# Patient Record
Sex: Female | Born: 1980 | Race: Black or African American | Hispanic: No | Marital: Married | Smoking: Never smoker
Health system: Southern US, Community
[De-identification: ages and names within clinical notes are randomized; demographics above are authoritative.]

## PROBLEM LIST (undated history)

## (undated) DIAGNOSIS — E119 Type 2 diabetes mellitus without complications: Secondary | ICD-10-CM

---

## 2017-06-17 DIAGNOSIS — E119 Type 2 diabetes mellitus without complications: Secondary | ICD-10-CM | POA: Insufficient documentation

## 2020-12-28 ENCOUNTER — Encounter (HOSPITAL_BASED_OUTPATIENT_CLINIC_OR_DEPARTMENT_OTHER): Payer: Self-pay

## 2020-12-28 ENCOUNTER — Other Ambulatory Visit: Payer: Self-pay

## 2020-12-28 ENCOUNTER — Emergency Department (HOSPITAL_BASED_OUTPATIENT_CLINIC_OR_DEPARTMENT_OTHER): Payer: BC Managed Care – PPO | Admitting: Radiology

## 2020-12-28 ENCOUNTER — Emergency Department (HOSPITAL_BASED_OUTPATIENT_CLINIC_OR_DEPARTMENT_OTHER)
Admission: EM | Admit: 2020-12-28 | Discharge: 2020-12-29 | Disposition: A | Discharge status: Home / Self Care | Payer: BC Managed Care – PPO | Attending: Emergency Medicine | Admitting: Emergency Medicine

## 2020-12-28 DIAGNOSIS — R0789 Other chest pain: Secondary | ICD-10-CM | POA: Diagnosis not present

## 2020-12-28 DIAGNOSIS — M25571 Pain in right ankle and joints of right foot: Secondary | ICD-10-CM | POA: Insufficient documentation

## 2020-12-28 DIAGNOSIS — E119 Type 2 diabetes mellitus without complications: Secondary | ICD-10-CM | POA: Diagnosis not present

## 2020-12-28 DIAGNOSIS — Y92410 Unspecified street and highway as the place of occurrence of the external cause: Secondary | ICD-10-CM | POA: Diagnosis not present

## 2020-12-28 DIAGNOSIS — S3991XA Unspecified injury of abdomen, initial encounter: Secondary | ICD-10-CM | POA: Diagnosis present

## 2020-12-28 DIAGNOSIS — M79642 Pain in left hand: Secondary | ICD-10-CM | POA: Insufficient documentation

## 2020-12-28 DIAGNOSIS — S301XXA Contusion of abdominal wall, initial encounter: Secondary | ICD-10-CM | POA: Diagnosis not present

## 2020-12-28 DIAGNOSIS — T07XXXA Unspecified multiple injuries, initial encounter: Secondary | ICD-10-CM

## 2020-12-28 DIAGNOSIS — S3981XA Other specified injuries of abdomen, initial encounter: Secondary | ICD-10-CM

## 2020-12-28 HISTORY — DX: Type 2 diabetes mellitus without complications: E11.9

## 2020-12-28 LAB — CBG MONITORING, ED: Glucose-Capillary: 110 mg/dL — ABNORMAL HIGH (ref 70–99)

## 2020-12-28 MED ORDER — HYDROCODONE-ACETAMINOPHEN 5-325 MG PO TABS
1.0000 | ORAL_TABLET | Freq: Once | ORAL | Status: AC
  Administered 2020-12-28: 1 via ORAL
  Filled 2020-12-28: qty 1
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
## 2020-12-29 MED ORDER — NAPROXEN 500 MG PO TABS: 500.0000 mg | ORAL_TABLET | Freq: Two times a day (BID) | ORAL | 0 refills | Status: DC

## 2020-12-29 MED ORDER — ACETAMINOPHEN 500 MG PO TABS: 500.0000 mg | ORAL_TABLET | Freq: Four times a day (QID) | ORAL | 0 refills | Status: DC | PRN
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
## 2020-12-30 ENCOUNTER — Ambulatory Visit (INDEPENDENT_AMBULATORY_CARE_PROVIDER_SITE_OTHER): Payer: Self-pay | Admitting: Orthopaedic Surgery

## 2020-12-30 ENCOUNTER — Other Ambulatory Visit: Payer: Self-pay

## 2020-12-30 VITALS — Ht 65.0 in | Wt 390.0 lb

## 2020-12-30 DIAGNOSIS — M25532 Pain in left wrist: Secondary | ICD-10-CM
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
## 2021-05-25 DIAGNOSIS — O99810 Abnormal glucose complicating pregnancy: Secondary | ICD-10-CM | POA: Insufficient documentation

## 2021-05-25 DIAGNOSIS — R87610 Atypical squamous cells of undetermined significance on cytologic smear of cervix (ASC-US): Secondary | ICD-10-CM | POA: Insufficient documentation

## 2021-05-25 DIAGNOSIS — I10 Essential (primary) hypertension: Secondary | ICD-10-CM | POA: Insufficient documentation

## 2021-05-25 DIAGNOSIS — IMO0002 Reserved for concepts with insufficient information to code with codable children: Secondary | ICD-10-CM | POA: Insufficient documentation

## 2021-07-20 DIAGNOSIS — Z9889 Other specified postprocedural states: Secondary | ICD-10-CM | POA: Insufficient documentation

## 2021-08-31 ENCOUNTER — Encounter: Payer: Self-pay | Admitting: Neurology

## 2021-08-31 ENCOUNTER — Ambulatory Visit: Payer: BC Managed Care – PPO | Admitting: Neurology

## 2021-08-31 ENCOUNTER — Telehealth: Payer: Self-pay | Admitting: Neurology

## 2021-08-31 VITALS — BP 145/88 | HR 70 | Ht 65.0 in | Wt 395.0 lb

## 2021-08-31 DIAGNOSIS — G43709 Chronic migraine without aura, not intractable, without status migrainosus: Secondary | ICD-10-CM

## 2021-08-31 DIAGNOSIS — G932 Benign intracranial hypertension: Secondary | ICD-10-CM | POA: Diagnosis not present

## 2021-08-31 NOTE — Progress Notes (Addendum)
Chief Complaint  Patient presents with   New Patient (Initial Visit)    Rm 15. Alone. NP/Paper/[facility] OD/psuedopapilledema of bilateral optic disc.      ASSESSMENT AND PLAN  Carolyn Cantrell is a 41 y.o. female   Pseudotumor cerebri  She does have risk factors of morbidly obese, blurry optic nerve head edge  Refer to ophthalmologist to confirm the findings, also do a formal visual field testing  MRI of the brain without contrast  Will refer for fluoroscopy guided lumbar puncture MRI of the brain is normal  Chronic migraine headaches  Aleve as needed    DIAGNOSTIC DATA (LABS, IMAGING, TESTING) - I reviewed patient records, labs, notes, testing and imaging myself where available.  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 Carrie Mew, OD for evaluation of bilateral papillary edema, her primary care is nurse practitioner Felix Pacini, initial evaluation was on August 31 2021  I reviewed and summarized the referring note. PMHX. HLD DM  She reported gradual weight gain over the past 3 years, began to have frequent headaches, usually behind her eyes, retro-orbital area, sometimes blurry vision, she thought she might need a new pair of glasses, was seen by optometrist after few years, was noted to have  possible bilateral papillary edema  She did have increased headache over the past few years, sometimes with associated light noise sensitivity, nauseous, lasting for few hours   PHYSICAL EXAM:   Vitals:   08/31/21 0824  BP: (!) 145/88  Pulse: 70  Weight: (!) 395 lb (179.2 kg)  Height: 5\' 5"  (1.651 m)   Not recorded     Body mass index is 65.73 kg/m.  PHYSICAL EXAMNIATION:  Gen: NAD, conversant, well nourised, well groomed                     Cardiovascular: Regular rate rhythm, no peripheral edema, warm, nontender. Eyes: Conjunctivae clear without exudates or hemorrhage Neck: Supple, no carotid bruits. Pulmonary: Clear to auscultation bilaterally   NEUROLOGICAL EXAM:  MENTAL STATUS: Speech/cognition: Awake, alert, oriented to history taking and casual conversation CRANIAL NERVES: CN II: Visual fields are full to confrontation. Pupils are round equal and briskly reactive to light.  Funduscopy examination showed blurry edge at bilateral optic nerve CN III, IV, VI: extraocular movement are normal. No ptosis. CN V: Facial sensation is intact to light touch CN VII: Face is symmetric with normal eye closure  CN VIII: Hearing is normal to causal conversation. CN IX, X: Phonation is normal. CN XI: Head turning and shoulder shrug are intact  MOTOR: There is no pronator drift of out-stretched arms. Muscle bulk and tone are normal. Muscle strength is normal.  REFLEXES: Reflexes are hypoactive and symmetric at the biceps, triceps, knees, and ankles. Plantar responses are flexor.  SENSORY: Intact to light touch, pinprick and vibratory sensation are intact in fingers and toes.  COORDINATION:  There is no trunk or limb dysmetria noted.  GAIT/STANCE: Need push-up to get up from seated position, limited by her big body habitus  REVIEW OF SYSTEMS:  Full 14 system review of systems performed and notable only for as above All other review of systems were  negative.   ALLERGIES: Allergies  Allergen Reactions   Cefaclor Hives    HOME MEDICATIONS: Current Outpatient Medications  Medication Sig Dispense Refill   Ascorbic Acid (VITAMIN C PO) Take by mouth.     atorvastatin (LIPITOR) 20 MG tablet Take 20 mg by mouth daily.     ELDERBERRY PO Take by mouth.     MAGNESIUM PO Take by mouth.     metFORMIN (GLUCOPHAGE) 500 MG tablet Take 500 mg by mouth 2 (two) times daily.     Multiple Vitamins-Minerals (MULTIVITAMIN ADULTS PO) Take by mouth.     Multiple Vitamins-Minerals (ZINC PO) Take by mouth.     Semaglutide (OZEMPIC, 0.25 OR 0.5 MG/DOSE, [place]) Inject 0.25 mg into the skin once a week.     VITAMIN D, CHOLECALCIFEROL, PO Take by mouth.     No current facility-administered medications for this visit.    PAST MEDICAL HISTORY: Past Medical History:  Diagnosis Date   Diabetes mellitus without complication (HCC)     PAST SURGICAL HISTORY: History reviewed. No pertinent surgical history.  FAMILY HISTORY: History reviewed. No pertinent family history.  SOCIAL HISTORY: Social History   Socioeconomic History   Marital status: Married    Spouse name: Not on file   Number of children: Not on file   Years of education: Not on file   Highest education level: Not on file  Occupational History   Not on file  Tobacco Use   Smoking status: Never   Smokeless tobacco: Never  Substance and Sexual Activity   Alcohol use: Not on file   Drug use: Not on file   Sexual activity: Not on file  Other Topics Concern   Not on file  Social History Narrative   Not on file   Social Determinants of Health   Financial Resource Strain: Not on file  Food Insecurity: Not on file  Transportation Needs: Not on file  Physical Activity: Not on file  Stress: Not on file  Social Connections: Not on file  Intimate Partner Violence: Not on file      Levert Feinstein, M.D. Ph.D.  [facility] [address], [address] [place], [place]  [address] Ph: [phone] Fax: [phone]  CC:  Carrie Mew, OD [address] [place],  [place] [address]  Felix Pacini, FNP

## 2021-08-31 NOTE — Telephone Encounter (Signed)
Referral for Ophthalmology sent to [facility] [phone].

## 2021-09-12 ENCOUNTER — Telehealth: Payer: Self-pay | Admitting: Neurology

## 2021-09-12 NOTE — Telephone Encounter (Signed)
[employer] NPR via website sent to GI 

## 2021-09-13 ENCOUNTER — Telehealth: Payer: Self-pay | Admitting: Neurology

## 2021-09-13 DIAGNOSIS — G932 Benign intracranial hypertension: Secondary | ICD-10-CM

## 2021-09-13 LAB — HM DIABETES EYE EXAM

## 2021-09-13 MED ORDER — TOPIRAMATE 100 MG PO TABS: 100.0000 mg | ORAL_TABLET | Freq: Two times a day (BID) | ORAL | 11 refills | Status: DC

## 2021-09-13 NOTE — Telephone Encounter (Signed)
Feedback from ophthalmologist Dr. Alden Hipp, patient does have grade 1-2 bilateral edema OU, close to 360 degree margin protein, no vessel obturation, definite evidence of papillary edema  She is on schedule for MRI of the brain July 5  Please move up her MRI of the brain to next 1 to 3 days  I am also ordered fluoroscopy guided lumbar puncture to be done after MRI of the brain  Also add on Topamax 100 mg twice daily for migraine prevention, decreased CSF secretion, more than 25% the patient would lose weight taking Topamax  Orders Placed This Encounter  Procedures   DG FL GUIDED LUMBAR PUNCTURE      Meds ordered this encounter  Medications   topiramate (TOPAMAX) 100 MG tablet    Sig: Take 1 tablet (100 mg total) by mouth 2 (two) times daily.    Dispense:  60 tablet    Refill:  11

## 2021-09-13 NOTE — Telephone Encounter (Signed)
I sent a message to [facility] asking them to move the MRI up

## 2021-09-14 NOTE — Telephone Encounter (Signed)
Called pt back. Advised Dr. Terrace Arabia is aware her MRI got moved to 09/16/21. She confirmed.  Advised of Dr. Catalina Gravel message: "Also add on Topamax 100 mg twice daily for migraine prevention, decreased CSF secretion, more than 25% the patient would lose weight taking Topamax" Pt confirmed she picked up this prescription yesterday. I went over directions. She will go ahead and start this.   She will also ask [facility] about scheduling LP for after MRI.

## 2021-09-16 ENCOUNTER — Ambulatory Visit
Admission: RE | Admit: 2021-09-16 | Discharge: 2021-09-16 | Disposition: A | Discharge status: Home / Self Care | Payer: BC Managed Care – PPO | Source: Ambulatory Visit | Attending: Neurology | Admitting: Neurology

## 2021-09-16 DIAGNOSIS — G932 Benign intracranial hypertension: Secondary | ICD-10-CM

## 2021-09-19 ENCOUNTER — Telehealth: Payer: Self-pay | Admitting: Neurology

## 2021-09-19 NOTE — Telephone Encounter (Signed)
Please call patient, MRI of the brain showed no significant abnormality, incidental changes of chronic paranasal sinusitis, partially empty sella with dilatation of the CSF space around retro-orbital portion of optic nerve,  Please make sure that she is on schedule for fluoroscopy guided lumbar puncture for intracranial hypertension

## 2021-09-20 NOTE — Telephone Encounter (Signed)
I spoke to the patient and provided her with the MRI results. She is scheduling her LP and will keep her pending appt on 10/10/21.  As a side note, she mentioned she was seen by her PCP today. Says she was placed on an antibiotic for a sinus infection.

## 2021-09-20 NOTE — Telephone Encounter (Signed)
Left message requesting a return call.

## 2021-09-25 ENCOUNTER — Telehealth: Payer: Self-pay | Admitting: *Deleted

## 2021-09-25 NOTE — Telephone Encounter (Signed)
I spoke with the patient and informed her of results. She stated her lumbar puncture has been scheduled for 10/04/2021. She verbalized understanding and expressed appreciation for the call.

## 2021-10-04 ENCOUNTER — Telehealth: Payer: Self-pay | Admitting: *Deleted

## 2021-10-04 ENCOUNTER — Ambulatory Visit
Admission: RE | Admit: 2021-10-04 | Discharge: 2021-10-04 | Disposition: A | Discharge status: Home / Self Care | Payer: BC Managed Care – PPO | Source: Ambulatory Visit | Attending: Neurology | Admitting: Neurology

## 2021-10-04 ENCOUNTER — Other Ambulatory Visit: Payer: BC Managed Care – PPO

## 2021-10-04 VITALS — BP 116/59 | HR 57

## 2021-10-04 DIAGNOSIS — G932 Benign intracranial hypertension: Secondary | ICD-10-CM

## 2021-10-04 NOTE — Telephone Encounter (Signed)
I spoke to the patient. She verbalized understanding of the findings. She will continue topiramate and keep her pending follow up with our office.

## 2021-10-04 NOTE — Telephone Encounter (Signed)
-----   Message from Levert Feinstein, MD sent at 10/04/2021  6:09 PM EDT ----- Please call patient, lumbar puncture on October 04, 2021 did show elevated opening pressure of 26 cm, the returned spinal fluid testing showed no significant abnormality.

## 2021-10-04 NOTE — Discharge Instructions (Signed)

## 2021-10-09 ENCOUNTER — Telehealth: Payer: Self-pay | Admitting: Neurology

## 2021-10-09 NOTE — Telephone Encounter (Signed)
Please call patient, lumbar puncture showed mildly elevated opening pressure 26 cm water, consistent with diagnosis of idiopathic intracranial hypertension, spinal fluid testing showed no significant abnormalities.  I would suggest Topamax 100 mg twice daily to treat the condition, will also help her headache  Common side effect of Topamax Dermatologic: Flushing (Pediatric, 5% ) Endocrine metabolic: Serum bicarbonate level outside reference range (25% to 67% ) Gastrointestinal: Loss of appetite (10% to 24% ), Weight decreased (4% to 21% ) Immunologic: Infectious disease (2% to 8% ) Neurologic: Confusion (3% to 11% ), Dizziness (4% to 25% ), Impaired cognition (2% to 7% ), Impaired psychomotor performance (2% to 13%), Memory impairment (3% to 12% ), Paresthesia (1% to 51% ), Reduced concentration span (2% to 10% ), Somnolence (6% to 29% ) Psychiatric: Feeling nervous (4% to 16% ), Mood disorder (4% to 11% ) Other: Fatigue (6% to 16% ), Fever (1% to 12% )

## 2021-10-09 NOTE — Telephone Encounter (Signed)
I spoke to the patient. She expressed understanding of the LP findings. She will continue taking topiramate 100mg , one tab BID and keep her pending follow up.

## 2021-10-10 ENCOUNTER — Encounter: Payer: Self-pay | Admitting: Neurology

## 2021-10-10 ENCOUNTER — Ambulatory Visit: Payer: BC Managed Care – PPO | Admitting: Neurology

## 2021-10-10 VITALS — BP 121/81 | HR 69 | Ht 65.0 in | Wt 385.0 lb

## 2021-10-10 DIAGNOSIS — G43709 Chronic migraine without aura, not intractable, without status migrainosus: Secondary | ICD-10-CM | POA: Diagnosis not present

## 2021-10-10 DIAGNOSIS — G932 Benign intracranial hypertension: Secondary | ICD-10-CM | POA: Diagnosis not present

## 2021-10-10 NOTE — Progress Notes (Signed)
Patient: Carolyn Cantrell Date of Birth: 08-14-1980  Reason for Visit: Follow up History from: Patient Primary Neurologist: Dr. Terrace Arabia  ASSESSMENT AND PLAN 41 y.o. year old female   1.  Pseudotumor cerebri 2.  Chronic migraine headache -Continue Topamax 100 mg twice a day, headaches have resolved -Keep appointment with Dr. Dione Booze 10/26/21 -LP 10/04/21 opening pressure 26 -MRI brain 09/16/21 showed no acute abnormalities, did show partially empty sella with dilation of CSF space around retro-orbital portion of optic nerves -Call for any worsening symptoms, follow-up in 6 months, will let me know after eye exam, I can request records   HISTORY  Carolyn Cantrell, is a 41 year old female, seen in request by Dr. Carrie Mew, OD for evaluation of bilateral papillary edema, her primary care is nurse practitioner Felix Pacini, initial evaluation was on August 31 2021   I reviewed and summarized the referring note. PMHX. HLD DM   She reported gradual weight gain over the past 3 years, began to have frequent headaches, usually behind her eyes, retro-orbital area, sometimes blurry vision, she thought she might need a new pair of glasses, was seen by optometrist after few years, was noted to have possible bilateral papillary edema   She did have increased headache over the past few years, sometimes with associated light noise sensitivity, nauseous, lasting for few hours  ophthalmology Alden Hipp evaluation from September 13, 2021 disc edema near 300 degree with relative preservation of margin temporally, no vessel obscuration bilaterally, CDR 0.2  Papillary edema associated with increased intracranial pressure, grade 1-2 edema OU, OCT confirms edema, HVF is essentially normal but borderline reliable, she is in no acute danger from ophthalmology perspective, to recommend full work-up with lumbar puncture, treatment initially with Diamox, and ultimately with weight loss, will repeat OCT to monitor for  change in about 6 weeks  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 On Topamax 100 mg twice a day. Since LP headaches have resolved. Seeing Dr. Dione Booze 10/26/21. Is also on Ozempic, had just started in June. Is now down 9 lbs.   MRI of the brain in June 2023 showed incidental changes of chronic paranasal sinusitis, partially empty sella with dilation of the CSF space around retro-orbital portion of optic nerve.   LP on October 04, 2021 showed elevated opening pressure 26 cm water, spinal fluid testing showed no significant abnormality.   REVIEW OF SYSTEMS: Out of a complete 14 system review of symptoms, the patient complains only of the following symptoms, and all other reviewed systems are negative.  See HPI  ALLERGIES: Allergies  Allergen Reactions   Cefaclor Hives    HOME MEDICATIONS: Outpatient Medications Prior to Visit  Medication Sig Dispense Refill   Ascorbic Acid (VITAMIN C PO) Take by mouth.     atorvastatin (LIPITOR) 20 MG tablet Take 20 mg by mouth daily.     ELDERBERRY PO Take by mouth.     MAGNESIUM PO Take by mouth.     metFORMIN (GLUCOPHAGE) 500 MG tablet Take 500 mg by mouth 2 (two) times daily.     Multiple Vitamins-Minerals (MULTIVITAMIN ADULTS PO) Take by mouth.     Multiple Vitamins-Minerals (ZINC PO) Take by mouth.     Semaglutide (OZEMPIC, 0.25 OR 0.5 MG/DOSE, [place]) Inject 0.5 mg into the skin once a week.     topiramate (TOPAMAX) 100 MG tablet Take 1 tablet (100 mg total) by mouth 2 (two) times daily. 60 tablet 11  VITAMIN D, CHOLECALCIFEROL, PO Take by mouth.     No facility-administered medications prior to visit.    PAST MEDICAL HISTORY: Past Medical History:  Diagnosis Date   Diabetes mellitus without complication (HCC)     PAST SURGICAL HISTORY: History reviewed. No pertinent surgical history.  FAMILY HISTORY: History reviewed. No pertinent family history.  SOCIAL HISTORY: Social History   Socioeconomic  History   Marital status: Married    Spouse name: Not on file   Number of children: Not on file   Years of education: Not on file   Highest education level: Not on file  Occupational History   Not on file  Tobacco Use   Smoking status: Never   Smokeless tobacco: Never  Substance and Sexual Activity   Alcohol use: Not on file   Drug use: Not on file   Sexual activity: Not on file  Other Topics Concern   Not on file  Social History Narrative   Not on file   Social Determinants of Health   Financial Resource Strain: Not on file  Food Insecurity: Not on file  Transportation Needs: Not on file  Physical Activity: Not on file  Stress: Not on file  Social Connections: Not on file  Intimate Partner Violence: Not on file    PHYSICAL EXAM  Vitals:   10/10/21 0744  BP: 121/81  Pulse: 69  Weight: (!) 385 lb (174.6 kg)  Height: 5\' 5"  (1.651 m)   Body mass index is 64.07 kg/m.  Generalized: Well developed, in no acute distress  Neurological examination  Mentation: Alert oriented to time, place, history taking. Follows all commands speech and language fluent Cranial nerve II-XII: Pupils were equal round reactive to light. Extraocular movements were full, visual field were full on confrontational test. Facial sensation and strength were normal. . Head turning and shoulder shrug  were normal and symmetric. Motor: The motor testing reveals 5 over 5 strength of all 4 extremities. Good symmetric motor tone is noted throughout.  Sensory: Sensory testing is intact to soft touch on all 4 extremities. No evidence of extinction is noted.  Coordination: Cerebellar testing reveals good finger-nose-finger and heel-to-shin bilaterally.  Gait and station: Gait is normal, limited by large body habitus Reflexes: Deep tendon reflexes are symmetric but decreased  DIAGNOSTIC DATA (LABS, IMAGING, TESTING) - I reviewed patient records, labs, notes, testing and imaging myself where available.  No  results found for: "WBC", "HGB", "HCT", "MCV", "PLT" No results found for: "NA", "K", "CL", "CO2", "GLUCOSE", "BUN", "CREATININE", "CALCIUM", "PROT", "ALBUMIN", "AST", "ALT", "ALKPHOS", "BILITOT", "GFRNONAA", "GFRAA" No results found for: "CHOL", "HDL", "LDLCALC", "LDLDIRECT", "TRIG", "CHOLHDL" No results found for: "HGBA1C" No results found for: "VITAMINB12" No results found for: "TSH"  , AGNP-C, DNP 10/10/2021, 8:05 AM [facility] [address] [place], [place] [phone]

## 2021-10-10 NOTE — Patient Instructions (Signed)
Continue the Topamax Keep appointment with Dr. Dione Booze, let me know and I can get the records  Call for any worsening symptoms Work on weight loss See you back in 6 months

## 2021-11-02 LAB — FUNGUS CULTURE W SMEAR
CULTURE:: NO GROWTH
MICRO NUMBER:: 13606333
SMEAR:: NONE SEEN
SPECIMEN QUALITY:: ADEQUATE

## 2021-11-02 LAB — GLUCOSE, CSF: Glucose, CSF: 65 mg/dL (ref 40–80)

## 2021-11-02 LAB — CSF CELL COUNT WITH DIFFERENTIAL
RBC Count, CSF: 0 cells/uL
TOTAL NUCLEATED CELL: 0 cells/uL (ref 0–5)

## 2021-11-02 LAB — GRAM STAIN
MICRO NUMBER:: 13606332
SPECIMEN QUALITY:: ADEQUATE

## 2021-11-02 LAB — VDRL, CSF: VDRL Quant, CSF: NONREACTIVE

## 2021-11-02 LAB — PROTEIN, CSF: Total Protein, CSF: 31 mg/dL (ref 15–45)

## 2022-04-18 ENCOUNTER — Ambulatory Visit: Payer: BC Managed Care – PPO | Admitting: Neurology

## 2022-05-16 ENCOUNTER — Telehealth: Payer: Self-pay | Admitting: Neurology

## 2022-05-16 ENCOUNTER — Ambulatory Visit: Payer: BC Managed Care – PPO | Admitting: Neurology

## 2022-05-16 ENCOUNTER — Encounter: Payer: Self-pay | Admitting: Neurology

## 2022-05-16 VITALS — BP 144/93 | HR 74 | Ht 65.0 in | Wt 387.0 lb

## 2022-05-16 DIAGNOSIS — G932 Benign intracranial hypertension: Secondary | ICD-10-CM

## 2022-05-16 DIAGNOSIS — G43709 Chronic migraine without aura, not intractable, without status migrainosus: Secondary | ICD-10-CM | POA: Diagnosis not present

## 2022-05-16 MED ORDER — TOPIRAMATE 100 MG PO TABS: 100.0000 mg | ORAL_TABLET | Freq: Two times a day (BID) | ORAL | 3 refills | Status: DC

## 2022-05-16 NOTE — Telephone Encounter (Signed)
Can you please get office records from visit with Dr. Katy Fitch in July and September 2023? Thank you!

## 2022-05-16 NOTE — Patient Instructions (Signed)
I will get the notes from Dr. Katy Fitch  For now we will continue the Topamax  Monitor for worsening headaches, vision change See you back in 6 months

## 2022-05-16 NOTE — Progress Notes (Addendum)
Patient: Carolyn Cantrell Date of Birth: 12-Aug-1980  Reason for Visit: Follow up History from: Patient Primary Neurologist: Dr. Terrace Arabia  ASSESSMENT AND PLAN 42 y.o. year old female   1.  Pseudotumor cerebri 2.  Chronic migraine headache  -I will request recent office visits from Dr. Dione Booze -Overall, clinically she is doing well, denies headaches or vision changes -We will continue Topamax 100 mg twice a day -Seeing Dr. Dione Booze next week -Discussed the importance of weight loss, she is motivated, for long-term management of pseudotumor -LP 10/04/21 opening pressure 26 -MRI brain 09/16/21 showed no acute abnormalities, did show partially empty sella with dilation of CSF space around retro-orbital portion of optic nerves -Call for any worsening symptoms, follow-up in 6 months, message me on MyChart next week after eye exam  Addendum, Dr. Dione Booze evaluation September 24, 2022, OCT RNFL no significant change, compared to previous study normal ganglion cell complex, grade 1 through 2 disc edema, 0 change over 5 studies, HVF is full OU, no blind spot enlargement, consider alternative diagnosis such as pseudopapillary edema,  Addendum: Dr. 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 Dione Booze on May 21, 2022, evidence of bilateral disc edema near 360 degree with relative preservation of margin temporarily, no vessel obscuration, CDR 0.2  Papillaedema grade 1-2 edema OU, changed, headache improving on Topamax, no changing on  OCT, strongly consider switch to Diamox, weight loss is advised, will repeat OCT in 3 months, diabetes type 2 with no ocular complications,  HISTORY  Carolyn Cantrell, is a 42 year old female, seen in request by Dr. Carrie Mew, OD for evaluation of bilateral papillary edema, her primary care is nurse practitioner Felix Pacini, initial evaluation was on August 31 2021   I reviewed and summarized the referring note. PMHX. HLD DM   She reported gradual weight gain over the past 3 years, began to have frequent headaches, usually behind her eyes, retro-orbital area, sometimes blurry vision, she thought she might need a new pair of glasses, was seen by optometrist after few years, was noted to have possible bilateral papillary edema   She did have increased headache over the past few years, sometimes with associated light noise sensitivity, nauseous, lasting for few hours  ophthalmology [facility] evaluation from September 13, 2021 disc edema near 300 degree with relative preservation of margin temporally, no vessel obscuration bilaterally, CDR 0.2  Papillary edema associated with increased intracranial pressure, grade 1-2 edema OU, OCT confirms edema, HVF is essentially normal but borderline reliable, she is in no acute danger from ophthalmology perspective, to recommend full work-up with lumbar puncture, treatment initially with Diamox, and ultimately with weight loss, will repeat OCT to monitor for change in about 6 weeks  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 On Topamax 100 mg twice a day. Since LP headaches have resolved. Seeing Dr. Dione Booze 10/26/21. Is also on Ozempic, had just started in June. Is now down 9 lbs.   MRI of the brain in June 2023 showed incidental changes of chronic  paranasal sinusitis, partially empty sella with dilation of the CSF space around retro-orbital portion of optic nerve.   LP on October 04, 2021 showed elevated opening  pressure 26 cm water, spinal fluid testing showed no significant abnormality.   Update May 16, 2022 SS: In dec had COVID, flu, had some sinus headaches. Rare headaches lately 1-2 a month, will take a nap, with relief, or Tylenol. Saw Dr. Dione Booze twice since last seen, reports was stable, she feels much better since LP. Remains on Topamax 100 mg twice daily. Is on Rybelsus for weight loss. Was off injectables for weight loss due to supply issues, weight is up 2 lbs since last seen at 387 lbs. Vision is fine. May consider weight loss surgery.   REVIEW OF SYSTEMS: Out of a complete 14 system review of symptoms, the patient complains only of the following symptoms, and all other reviewed systems are negative.  See HPI  ALLERGIES: Allergies  Allergen Reactions   Cefaclor Hives    HOME MEDICATIONS: Outpatient Medications Prior to Visit  Medication Sig Dispense Refill   Ascorbic Acid (VITAMIN C PO) Take by mouth.     atorvastatin (LIPITOR) 20 MG tablet Take 20 mg by mouth daily.     metFORMIN (GLUCOPHAGE) 500 MG tablet Take 500 mg by mouth 2 (two) times daily.     Multiple Vitamins-Minerals (MULTIVITAMIN ADULTS PO) Take by mouth.     Multiple Vitamins-Minerals (ZINC PO) Take by mouth.     RYBELSUS 7 MG TABS Take 1 tablet by mouth daily.     VITAMIN D, CHOLECALCIFEROL, PO Take by mouth.     topiramate (TOPAMAX) 100 MG tablet Take 1 tablet (100 mg total) by mouth 2 (two) times daily. 60 tablet 11   ELDERBERRY PO Take by mouth. (Patient not taking: Reported on 05/16/2022)     MAGNESIUM PO Take by mouth. (Patient not taking: Reported on 05/16/2022)     Semaglutide (OZEMPIC, 0.25 OR 0.5 MG/DOSE, [place]) Inject 0.5 mg into the skin once a week. (Patient not taking: Reported on 05/16/2022)     No facility-administered medications prior to visit.    PAST MEDICAL HISTORY: Past Medical History:  Diagnosis Date   Diabetes mellitus without complication (HCC)     PAST SURGICAL HISTORY: No past  surgical history on file.  FAMILY HISTORY: No family history on file.  SOCIAL HISTORY: Social History   Socioeconomic History   Marital status: Married    Spouse name: Not on file   Number of children: Not on file   Years of education: Not on file   Highest education level: Not on file  Occupational History   Not on file  Tobacco Use   Smoking status: Never   Smokeless tobacco: Never  Substance and Sexual Activity   Alcohol use: Not on file   Drug use: Not on file   Sexual activity: Not on file  Other Topics Concern   Not on file  Social History Narrative   Not on file   Social Determinants of Health   Financial Resource Strain: Not on file  Food Insecurity: Not on file  Transportation Needs: Not on file  Physical Activity: Not on file  Stress: Not on file  Social Connections: Not on file  Intimate Partner Violence: Not on file    PHYSICAL EXAM  Vitals:   05/16/22 0940  BP: (!) 144/93  Pulse: 74  Weight: (!) 387 lb (175.5 kg)  Height: 5\' 5"  (1.651 m)    Body  mass index is 64.4 kg/m.  Generalized: Well developed, in no acute distress  Neurological examination  Mentation: Alert oriented to time, place, history taking. Follows all commands speech and language fluent Cranial nerve II-XII: Pupils were equal round reactive to light. Extraocular movements were full, visual field were full on confrontational test. Facial sensation and strength were normal. . Head turning and shoulder shrug  were normal and symmetric. Motor: The motor testing reveals 5 over 5 strength of all 4 extremities. Good symmetric motor tone is noted throughout.  Sensory: Sensory testing is intact to soft touch on all 4 extremities. No evidence of extinction is noted.  Coordination: Cerebellar testing reveals good finger-nose-finger and heel-to-shin bilaterally.  Gait and station: Gait is normal, limited by large body habitus Reflexes: Deep tendon reflexes are symmetric but  decreased  DIAGNOSTIC DATA (LABS, IMAGING, TESTING) - I reviewed patient records, labs, notes, testing and imaging myself where available.  No results found for: "WBC", "HGB", "HCT", "MCV", "PLT" No results found for: "NA", "K", "CL", "CO2", "GLUCOSE", "BUN", "CREATININE", "CALCIUM", "PROT", "ALBUMIN", "AST", "ALT", "ALKPHOS", "BILITOT", "GFRNONAA", "GFRAA" No results found for: "CHOL", "HDL", "LDLCALC", "LDLDIRECT", "TRIG", "CHOLHDL" No results found for: "HGBA1C" No results found for: "VITAMINB12" No results found for: "TSH"  Margie Ege, AGNP-C, DNP 05/16/2022, 10:07 AM [facility] [address] [place], [place] [address] [phone]

## 2022-05-30 ENCOUNTER — Telehealth: Payer: Self-pay | Admitting: Neurology

## 2022-05-30 DIAGNOSIS — G932 Benign intracranial hypertension: Secondary | ICD-10-CM

## 2022-05-30 DIAGNOSIS — G43709 Chronic migraine without aura, not intractable, without status migrainosus: Secondary | ICD-10-CM

## 2022-05-30 NOTE — Telephone Encounter (Signed)
Please call patient, with her continued evidence of pedal edema, I would suggest weight loss program, better get referral from her primary care, if needed, our office can refer to    reviewed ophthalmology evaluation by Dr. Katy Fitch on May 21, 2022, evidence of bilateral disc edema near 360 degree with relative preservation of margin temporarily, no vessel obscuration, CDR 0.2  Papillaedema grade 1-2 edema OU, changed, headache improving on Topamax, no changing on OCT, strongly consider switch to Diamox, weight loss is advised, will repeat OCT in 3 months, diabetes type 2 with no ocular complications,

## 2022-05-31 NOTE — Addendum Note (Signed)
Addended by: Suzzanne Cloud on: 05/31/2022 05:02 PM   Modules accepted: Orders

## 2022-05-31 NOTE — Telephone Encounter (Addendum)
Please try to reach her, I tried to call the patient twice, I left a message.  If she desires to switch to Diamox we can start with 500 mg twice a day for 2 weeks then increase to 1000 mg twice a day.  I will place the referral for healthy weight and wellness. Again, not sure that Diamox vs Topamax will make much difference, the key is going to be weight loss.   Orders Placed This Encounter  Procedures   Amb Ref to Medical Weight Management

## 2022-05-31 NOTE — Telephone Encounter (Signed)
Called and spoke to patient, she is agreeable for the referral to weight loss program. Says she would prefer that we do the referral.  She also wanted to let Judson Roch know that Diamox was the preferred medication at her other appointment.  I will forward to Dr. Krista Blue and Judson Roch for further Review

## 2022-06-04 NOTE — Telephone Encounter (Signed)
Called and spoke to patient, she was understanding and wiling to try the diamox.  Pt verbalized understanding. Pt had no questions at this time but was encouraged to call back if questions arise.

## 2022-06-26 ENCOUNTER — Ambulatory Visit (INDEPENDENT_AMBULATORY_CARE_PROVIDER_SITE_OTHER): Payer: BC Managed Care – PPO | Admitting: Nurse Practitioner

## 2022-06-26 ENCOUNTER — Encounter: Payer: Self-pay | Admitting: Nurse Practitioner

## 2022-06-26 VITALS — BP 149/84 | HR 60 | Temp 98.3°F | Ht 65.0 in | Wt 386.0 lb

## 2022-06-26 DIAGNOSIS — E119 Type 2 diabetes mellitus without complications: Secondary | ICD-10-CM | POA: Diagnosis not present

## 2022-06-26 DIAGNOSIS — Z6841 Body Mass Index (BMI) 40.0 and over, adult: Secondary | ICD-10-CM

## 2022-06-26 DIAGNOSIS — E7849 Other hyperlipidemia: Secondary | ICD-10-CM | POA: Diagnosis not present

## 2022-06-26 DIAGNOSIS — Z7984 Long term (current) use of oral hypoglycemic drugs: Secondary | ICD-10-CM

## 2022-06-26 NOTE — Patient Instructions (Signed)
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
## 2022-11-20 ENCOUNTER — Ambulatory Visit: Payer: BC Managed Care – PPO | Admitting: Neurology

## 2022-11-20 ENCOUNTER — Encounter: Payer: Self-pay | Admitting: Neurology

## 2022-11-20 VITALS — BP 131/71 | HR 94 | Ht 65.0 in | Wt 390.0 lb

## 2022-11-20 DIAGNOSIS — G43709 Chronic migraine without aura, not intractable, without status migrainosus: Secondary | ICD-10-CM | POA: Diagnosis not present

## 2022-11-20 DIAGNOSIS — G932 Benign intracranial hypertension: Secondary | ICD-10-CM

## 2022-11-20 MED ORDER — TOPIRAMATE 100 MG PO TABS: 100.0000 mg | ORAL_TABLET | Freq: Two times a day (BID) | ORAL | 3 refills | Status: DC

## 2022-11-20 NOTE — Progress Notes (Signed)
Patient: Carolyn Cantrell Date of Birth: 27-Feb-1981  Reason for Visit: Follow up History from: Patient Primary Neurologist: Dr. Terrace Arabia  ASSESSMENT AND PLAN 42 y.o. year old female   1.  Pseudotumor cerebri 2.  Chronic migraine headache  -Continue Topamax 100 mg twice daily -Planning for gastric sleeve operation for weight loss this year.  We discussed weight loss key for long-term management of IIH -Continue follow-up with Dr. Dione Booze, notes indicate stable disc edema, no change in the last 5 visits, consider pseudopapillary edema, no blind spot enlargement -LP 10/04/21 opening pressure 26 -MRI brain 09/16/21 showed no acute abnormalities, did show partially empty sella with dilation of CSF space around retro-orbital portion of optic nerves -Follow-up in 6 months or sooner if needed, let me know after ophthalmology visit in October, I can request notes  Addendum, Dr. Dione Booze evaluation September 24, 2022, OCT RNFL no significant change, compared to previous study normal ganglion cell complex, grade 1 through 2 disc edema, 0 change over 5 studies, HVF is full OU, no blind spot enlargement, consider alternative diagnosis such as pseudopapillary edema,  Addendum: Dr. 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 Carrie Mew, OD for evaluation of bilateral papillary edema, her primary care is nurse practitioner Felix Pacini, initial evaluation was on August 31 2021   I reviewed and summarized the referring note. PMHX. HLD DM   She reported gradual weight gain over the past 3 years, began to have frequent headaches, usually behind her eyes, retro-orbital area, sometimes blurry vision, she thought she might need a new pair of glasses, was seen by optometrist after few years, was noted to have possible bilateral papillary edema   She did have increased headache over the past few years, sometimes with associated light noise sensitivity, nauseous, lasting for few hours  ophthalmology Alden Hipp evaluation from September 13, 2021 disc edema near 300 degree with relative preservation of margin temporally, no vessel obscuration bilaterally, CDR 0.2  Papillary edema associated with increased intracranial pressure, grade 1-2 edema OU, OCT confirms edema, HVF is essentially normal but borderline reliable, she is in no acute danger from ophthalmology perspective, to recommend full work-up with lumbar puncture, treatment initially with Diamox, and ultimately with weight loss, will repeat OCT to monitor for change in about 6 weeks  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 On Topamax 100 mg twice a day. Since LP headaches have resolved. Seeing Dr. Dione Booze 10/26/21. Is also on Ozempic, had just started in June. Is now down 9 lbs.   MRI of the brain in June 2023  showed incidental changes of chronic paranasal sinusitis, partially empty sella with dilation of the CSF space around retro-orbital portion of optic nerve.   LP on October 04, 2021 showed elevated opening pressure 26 cm water, spinal fluid testing showed no significant abnormality.   Update May 16, 2022 SS: In dec had COVID, flu, had some sinus headaches. Rare headaches lately 1-2 a month, will take a nap, with relief, or Tylenol. Saw Dr. Dione Booze twice since last seen, reports was stable, she feels much better since LP. Remains on Topamax 100 mg twice daily. Is on Rybelsus for weight loss. Was off injectables for weight loss due to supply issues, weight is up 2 lbs since last seen at 387 lbs. Vision is fine. May consider weight loss surgery.   Update November 20, 2022 SS: 1 migraine in last month. Average 4 migraines a month, usually due to lack of sleep. Going to have bariatric surgery sleeve. Remains on Topamax 100 mg BID. Saw Dr. Dione Booze, felt to be stable, thinking this may be her baseline. Follow up in October. Since LP in July 2023, less headache. Feels doing very well.   REVIEW OF SYSTEMS: Out of a complete 14 system review of symptoms, the patient complains only of the following symptoms, and all other reviewed systems are negative.  See HPI  ALLERGIES: Allergies  Allergen Reactions   Cefaclor Hives    HOME MEDICATIONS: Outpatient Medications Prior to Visit  Medication Sig Dispense Refill   Ascorbic Acid (VITAMIN C PO) Take by mouth.     atorvastatin (LIPITOR) 20 MG tablet Take 20 mg by mouth daily.     ELDERBERRY PO Take by mouth.     MAGNESIUM PO Take by mouth.     metFORMIN (GLUCOPHAGE) 500 MG tablet Take 500 mg by mouth 2 (two) times daily.     Multiple Vitamins-Minerals (MULTIVITAMIN ADULTS PO) Take by mouth.     Multiple Vitamins-Minerals (ZINC PO) Take by mouth.     VITAMIN D, CHOLECALCIFEROL, PO Take by mouth.     topiramate (TOPAMAX) 100 MG tablet Take 1 tablet (100 mg total) by mouth 2 (two) times daily. 180 tablet 3   RYBELSUS 7 MG TABS Take 1 tablet by mouth daily. (Patient not taking: Reported on 06/26/2022)     Semaglutide (OZEMPIC, 0.25 OR  0.5 MG/DOSE, [place]) Inject 0.5 mg into the skin once a week. (Patient not taking: Reported on 05/16/2022)     No facility-administered medications prior to visit.    PAST MEDICAL HISTORY: Past Medical History:  Diagnosis Date   Diabetes mellitus without complication (HCC)     PAST SURGICAL HISTORY: No past surgical history on file.  FAMILY HISTORY: No family history on file.  SOCIAL HISTORY: Social History   Socioeconomic History   Marital status: Married    Spouse name: Not on file   Number of children: Not on file   Years of education: Not on file   Highest education level: Not on file  Occupational History   Not on file  Tobacco Use   Smoking status: Never   Smokeless tobacco: Never  Substance and Sexual Activity   Alcohol use: Not on file   Drug use: Not on file   Sexual activity: Not on file  Other Topics Concern   Not on file  Social History Narrative   Not on file   Social Determinants of Health   Financial Resource Strain: Not on file  Food Insecurity: Not on file  Transportation Needs:  Not on file  Physical Activity: Not on file  Stress: Not on file (05/12/2019)  Social Connections: Not on file  Intimate Partner Violence: Low Risk  (07/17/2019)   [facility]   Intimate Partner Violence    Insults You: Not on file    Threatens You: Not on file    Screams at You: Not on file    Physically Hurt: Not on file    Intimate Partner Violence Score: Not on file    PHYSICAL EXAM  Vitals:   11/20/22 1457  BP: 131/71  Pulse: 94  Weight: (!) 390 lb (176.9 kg)  Height: 5\' 5"  (1.651 m)   Body mass index is 64.9 kg/m.  Generalized: Well developed, in no acute distress  Neurological examination  Mentation: Alert oriented to time, place, history taking. Follows all commands speech and language fluent Cranial nerve II-XII: Pupils were equal round reactive to light. Extraocular movements were full, visual field were full on confrontational test.  Facial sensation and strength were normal. . Head turning and shoulder shrug  were normal and symmetric. Motor: The motor testing reveals 5 over 5 strength of all 4 extremities. Good symmetric motor tone is noted throughout.  Sensory: Sensory testing is intact to soft touch on all 4 extremities. No evidence of extinction is noted.  Coordination: Cerebellar testing reveals good finger-nose-finger and heel-to-shin bilaterally.  Gait and station: Gait is normal, limited by large body habitus  DIAGNOSTIC DATA (LABS, IMAGING, TESTING) - I reviewed patient records, labs, notes, testing and imaging myself where available.  No results found for: "WBC", "HGB", "HCT", "MCV", "PLT" No results found for: "NA", "K", "CL", "CO2", "GLUCOSE", "BUN", "CREATININE", "CALCIUM", "PROT", "ALBUMIN", "AST", "ALT", "ALKPHOS", "BILITOT", "GFRNONAA", "GFRAA" No results found for: "CHOL", "HDL", "LDLCALC", "LDLDIRECT", "TRIG", "CHOLHDL" No results found for: "HGBA1C" No results found for: "VITAMINB12" No results found for: "TSH"  Margie Ege, AGNP-C, DNP 11/20/2022, 3:30 PM [facility] [address] [place], [place] [address] [phone]

## 2022-11-20 NOTE — Patient Instructions (Signed)
Continue Topamax 100 mg twice daily, continue to pursue weight loss, continue to see Dr. Dione Booze

## 2023-01-08 DIAGNOSIS — R5383 Other fatigue: Secondary | ICD-10-CM | POA: Insufficient documentation

## 2023-01-08 DIAGNOSIS — Z136 Encounter for screening for cardiovascular disorders: Secondary | ICD-10-CM | POA: Insufficient documentation

## 2023-01-10 DIAGNOSIS — E559 Vitamin D deficiency, unspecified: Secondary | ICD-10-CM | POA: Insufficient documentation

## 2023-05-30 ENCOUNTER — Ambulatory Visit: Payer: BC Managed Care – PPO | Admitting: Neurology

## 2023-07-02 NOTE — Progress Notes (Unsigned)
 Patient: Carolyn Cantrell Date of Birth: October 22, 1980  Reason for Visit: Follow up History from: Patient Primary Neurologist: Dr. Terrace Arabia  ASSESSMENT AND PLAN 43 y.o. year old female   1.  Pseudotumor cerebri 2.  Chronic migraine headache  -Migraines under very good control, 1 every 4-6 weeks -Has lost 40 pounds since last seen!! -Change Topamax to 200 mg at bedtime to see if less nausea when combined with Ozempic (currently taking Topamax 100 mg BID) -Continue to work on weight loss, planning for bariatric surgery -Continue follow-up with Dr. Dione Booze, notes indicate stable disc edema, no change in the last 5 visits, consider pseudopapillary edema, no blind spot enlargement -LP 10/04/21 opening pressure 26 -MRI brain 09/16/21 showed no acute abnormalities, did show partially empty sella with dilation of CSF space around retro-orbital portion of optic nerves -Follow-up in 6 months or sooner if needed, let me know after ophthalmology visit with Dr. Dione Booze in June, I can request the records  Addendum, Dr. Dione Booze evaluation September 24, 2022, OCT RNFL no significant change, compared to previous study normal ganglion cell complex, grade 1 through 2 disc edema, 0 change over 5 studies, HVF is full OU, no blind spot enlargement, consider alternative diagnosis such as pseudopapillary edema,  Addendum: Dr. 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 Carrie Mew, OD for evaluation of bilateral papillary edema, her primary care is nurse practitioner Felix Pacini, initial evaluation was on August 31 2021   I reviewed and summarized the referring note. PMHX. HLD DM   She reported gradual weight gain over the past 3 years, began to have frequent headaches, usually behind her eyes, retro-orbital area, sometimes blurry vision, she thought she might need a new pair of glasses, was seen by optometrist after few years, was noted to have possible bilateral papillary edema   She did have increased headache over the past few years, sometimes with associated light noise sensitivity, nauseous, lasting for few hours  ophthalmology [facility] Hipp evaluation from September 13, 2021 disc edema near 300 degree with relative preservation of margin temporally, no vessel obscuration bilaterally, CDR 0.2  Papillary edema associated with increased intracranial pressure, grade 1-2 edema OU, OCT confirms edema, HVF is essentially normal but borderline reliable, she is in no acute danger from ophthalmology perspective, to recommend full work-up with lumbar puncture, treatment initially with Diamox, and ultimately with weight loss, will repeat OCT to monitor for change in about 6 weeks  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 On Topamax 100 mg twice a day. Since LP headaches have resolved. Seeing Dr.  Groat 10/26/21. Is also on Ozempic, had just started in June. Is now down 9 lbs.   MRI of the brain in June 2023 showed incidental changes of chronic  paranasal sinusitis, partially empty sella with dilation of the CSF space around retro-orbital portion of optic nerve.   LP on October 04, 2021 showed elevated opening pressure 26 cm water, spinal fluid testing showed no significant abnormality.   Update May 16, 2022 SS: In dec had COVID, flu, had some sinus headaches. Rare headaches lately 1-2 a month, will take a nap, with relief, or Tylenol. Saw Dr. Dione Booze twice since last seen, reports was stable, she feels much better since LP. Remains on Topamax 100 mg twice daily. Is on Rybelsus for weight loss. Was off injectables for weight loss due to supply issues, weight is up 2 lbs since last seen at 387 lbs. Vision is fine. May consider weight loss surgery.   Update November 20, 2022 SS: 1 migraine in last month. Average 4 migraines a month, usually due to lack of sleep. Going to have bariatric surgery sleeve. Remains on Topamax 100 mg BID. Saw Dr. Dione Booze, felt to be stable, thinking this may be her baseline. Follow up in October. Since LP in July 2023, less headache. Feels doing very well.   Update July 03, 2023 SS: Is on Ozempic, has been on for 3 months, has lost almost 40 lbs since Aug!! Is planning for bariatric surgery, needs to have BMI at 55. Taking Topamax 100 mg BID, with Ozempic, feels nauseated. Seeing Dr. Dione Booze in June. Has 1-2 migraines a month, triggered by lack of sleep. Has migraine today, but none in 6 weeks. A lot of stress. Uses Tylenol for migraine with good benefit.   REVIEW OF SYSTEMS: Out of a complete 14 system review of symptoms, the patient complains only of the following symptoms, and all other reviewed systems are negative.  See HPI  ALLERGIES: Allergies  Allergen Reactions   Cefaclor Hives    HOME MEDICATIONS: Outpatient Medications Prior to Visit  Medication Sig Dispense Refill   OZEMPIC, 2 MG/DOSE, 8 MG/3ML SOPN Inject 1 Pen into the skin every 7 (seven) days.     atorvastatin (LIPITOR) 20 MG tablet Take 20 mg by  mouth daily.     metFORMIN (GLUCOPHAGE) 500 MG tablet Take 500 mg by mouth 2 (two) times daily.     Multiple Vitamins-Minerals (MULTIVITAMIN ADULTS PO) Take by mouth.     Multiple Vitamins-Minerals (ZINC PO) Take by mouth.     topiramate (TOPAMAX) 100 MG tablet Take 1 tablet (100 mg total) by mouth 2 (two) times daily. 180 tablet 3   Ascorbic Acid (VITAMIN C PO) Take by mouth.     ELDERBERRY PO Take by mouth.     MAGNESIUM PO Take by mouth.     VITAMIN D, CHOLECALCIFEROL, PO Take by mouth.     No facility-administered medications prior to visit.    PAST MEDICAL HISTORY: Past Medical History:  Diagnosis Date   Diabetes mellitus without complication (HCC)     PAST SURGICAL HISTORY: No past surgical history on file.  FAMILY HISTORY: No family history on file.  SOCIAL HISTORY: Social History   Socioeconomic History   Marital status: Married    Spouse name: Not on file   Number of children: Not on file   Years of education: Not on file   Highest education level: Not on file  Occupational History   Not on file  Tobacco Use  Smoking status: Never   Smokeless tobacco: Never  Substance and Sexual Activity   Alcohol use: Not on file   Drug use: Not Currently   Sexual activity: Yes    Birth control/protection: None  Other Topics Concern   Not on file  Social History Narrative   Not on file   Social Drivers of Health   Financial Resource Strain: Low Risk  (04/11/2023)   Received from [facility]   Overall Financial Resource Strain (CARDIA)    Difficulty of Paying Living Expenses: Not hard at all  Food Insecurity: No Food Insecurity (04/11/2023)   Received from [facility]   Hunger Vital Sign    Worried About Running Out of Food in the Last Year: Never true    Ran Out of Food in the Last Year: Never true  Transportation Needs: No Transportation Needs (04/11/2023)   Received from [facility] - Transportation    Lack of Transportation (Medical): No    Lack  of Transportation (Non-Medical): No  Physical Activity: Insufficiently Active (01/08/2023)   Received from [facility]   Exercise Vital Sign    Days of Exercise per Week: 2 days    Minutes of Exercise per Session: 20 min  Stress: No Stress Concern Present (01/08/2023)   Received from [facility] - Occupational Stress Questionnaire    Feeling of Stress : Only a little  Social Connections: Socially Integrated (01/08/2023)   Received from [facility]   Social Network    How would you rate your social network (family, work, friends)?: Good participation with social networks  Intimate Partner Violence: Not At Risk (01/08/2023)   Received from [facility]   HITS    Over the last 12 months how often did your partner physically hurt you?: Never    Over the last 12 months how often did your partner insult you or talk down to you?: Never    Over the last 12 months how often did your partner threaten you with physical harm?: Never    Over the last 12 months how often did your partner scream or curse at you?: Never    PHYSICAL EXAM  Vitals:   07/03/23 0747  BP: (!) 150/93  Pulse: 82  Weight: (!) 362 lb 12.8 oz (164.6 kg)  Height: 5\' 5"  (1.651 m)    Body mass index is 60.37 kg/m.  Generalized: Well developed, in no acute distress  Neurological examination  Mentation: Alert oriented to time, place, history taking. Follows all commands speech and language fluent Cranial nerve II-XII: Pupils were equal round reactive to light. Extraocular movements were full, visual field were full on confrontational test. Facial sensation and strength were normal. . Head turning and shoulder shrug  were normal and symmetric. Motor: The motor testing reveals 5 over 5 strength of all 4 extremities. Good symmetric motor tone is noted throughout.  Sensory: Sensory testing is intact to soft touch on all 4 extremities. No evidence of extinction is noted.   Coordination: Cerebellar testing reveals good finger-nose-finger bilaterally Gait and station: Gait is normal, limited by large body habitus  DIAGNOSTIC DATA (LABS, IMAGING, TESTING) - I reviewed patient records, labs, notes, testing and imaging myself where available.  No results found for: "WBC", "HGB", "HCT", "MCV", "PLT" No results found for: "NA", "K", "CL", "CO2", "GLUCOSE", "BUN", "CREATININE", "CALCIUM", "PROT", "ALBUMIN", "AST", "ALT", "ALKPHOS", "BILITOT", "GFRNONAA", "GFRAA" No results found for: "CHOL", "HDL", "LDLCALC", "LDLDIRECT", "TRIG", "CHOLHDL" No  results found for: "HGBA1C" No results found for: "VITAMINB12" No results found for: "TSH"  Margie Ege, AGNP-C, DNP 07/03/2023, 7:49 AM [facility] [address] [place], [place] [address] [phone]

## 2023-07-03 ENCOUNTER — Encounter: Payer: Self-pay | Admitting: Neurology

## 2023-07-03 ENCOUNTER — Ambulatory Visit: Payer: BC Managed Care – PPO | Admitting: Neurology

## 2023-07-03 VITALS — BP 150/93 | HR 82 | Ht 65.0 in | Wt 362.8 lb

## 2023-07-03 DIAGNOSIS — G43709 Chronic migraine without aura, not intractable, without status migrainosus: Secondary | ICD-10-CM | POA: Diagnosis not present

## 2023-07-03 DIAGNOSIS — G932 Benign intracranial hypertension: Secondary | ICD-10-CM | POA: Diagnosis not present

## 2023-07-03 MED ORDER — TOPIRAMATE 100 MG PO TABS: 100.0000 mg | ORAL_TABLET | Freq: Two times a day (BID) | ORAL | 3 refills | Status: AC

## 2023-07-03 NOTE — Patient Instructions (Signed)
 Great to see you today.  Try taking Topamax 200 mg at bedtime to see if less nausea.  Great job on the [employer] loss!!  Continue close follow-up with weight management.  Keep ophthalmology evaluation with Dr. Dione Booze in June, let me know and I can request the records.  Follow-up in 6 months.  Let me know if your headaches increase or if you develop any vision changes.  Thanks!!
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
## 2023-12-05 DIAGNOSIS — N939 Abnormal uterine and vaginal bleeding, unspecified: Secondary | ICD-10-CM | POA: Insufficient documentation

## 2024-03-09 ENCOUNTER — Telehealth: Payer: Self-pay | Admitting: Neurology

## 2024-03-09 NOTE — Telephone Encounter (Signed)
 LVM and sent MyChart msg advising pt of office closure.

## 2024-03-10 ENCOUNTER — Ambulatory Visit: Admitting: Neurology

## 2024-04-08 ENCOUNTER — Ambulatory Visit: Admitting: Neurology

## 2024-04-08 ENCOUNTER — Encounter: Payer: Self-pay | Admitting: Neurology

## 2024-04-08 VITALS — BP 111/79 | Ht 65.0 in | Wt 323.5 lb

## 2024-04-08 DIAGNOSIS — G932 Benign intracranial hypertension: Secondary | ICD-10-CM | POA: Diagnosis not present

## 2024-04-08 DIAGNOSIS — G43709 Chronic migraine without aura, not intractable, without status migrainosus: Secondary | ICD-10-CM | POA: Diagnosis not present

## 2024-04-08 NOTE — Patient Instructions (Addendum)
 Continue current dose of Topamax  Continue to work on weight loss Follow up with Dr. Octavia  Monitor for worsening headache, vision change  Follow up in 6 months

## 2024-04-08 NOTE — Progress Notes (Signed)
 "  Patient: Carolyn Cantrell Date of Birth: 1980-04-05  Reason for Visit: Follow up History from: Patient Primary Neurologist: Dr. Onita  ASSESSMENT AND PLAN 44 y.o. year old female   1.  Pseudotumor cerebri 2.  Chronic migraine headache  - Recent gastric sleeve surgery, since October has lost 50 pounds! - Reduced dose of Topamax  down to 100 mg at bedtime - Headaches under good control, no vision change  - Reports stable evaluation with Dr. Octavia in the summer, due to reschedule, will be interesting to see exam with recent weight loss, notes have indicated stable disc edema, no change in the last 5 visits, consider pseudopapillary edema, no blind spot enlargement -LP 10/04/21 opening pressure 26 -MRI brain 09/16/21 showed no acute abnormalities, did show partially empty sella with dilation of CSF space around retro-orbital portion of optic nerves -Follow-up in 6 months or sooner if needed, my chart message after visit and I can request the notes  HISTORY  Carolyn Cantrell, is a 44 year old female, seen in request by Dr. Ritter, Kevin J, OD for evaluation of bilateral papillary edema, her primary care is nurse practitioner Elizbeth Leita Ruth, initial evaluation was on August 31 2021   I reviewed and summarized the referring note. PMHX. HLD DM   She reported gradual weight gain over the past 3 years, began to have frequent headaches, usually behind her eyes, retro-orbital area, sometimes blurry vision, she thought she might need a new pair of glasses, was seen by optometrist after few years, was noted to have possible bilateral papillary edema   She did have increased headache over the past few years, sometimes with associated light noise sensitivity, nauseous, lasting for few hours  ophthalmology Eyvonne evaluation from September 13, 2021 disc edema near 300 degree with relative preservation of margin temporally, no vessel obscuration bilaterally, CDR 0.2  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 On Topamax  100 mg twice a day. Since LP headaches have resolved. Seeing Dr. Octavia 10/26/21. Is also on Ozempic, had just started in June. Is now down 9 lbs.   MRI of the brain in June 2023 showed incidental changes of chronic paranasal sinusitis, partially empty sella with dilation of the CSF space around retro-orbital portion of optic nerve.   LP on October 04, 2021 showed elevated opening pressure 26 cm water, spinal fluid testing showed no significant abnormality.   Update May 16, 2022 SS: In dec had COVID, flu, had some sinus headaches. Rare headaches lately 1-2 a month, will take a nap, with relief, or Tylenol . Saw Dr. Octavia twice since last seen, reports was stable, she feels much better since LP. Remains on Topamax  100 mg twice daily. Is on Rybelsus for weight loss. Was off injectables for weight loss due to supply issues, weight is up 2 lbs since last seen at 387 lbs. Vision is fine. May consider weight loss surgery.   Update November 20, 2022 SS: 1 migraine in last month. Average 4 migraines a month, usually due to lack of sleep. Going to have bariatric surgery sleeve. Remains on Topamax  100 mg BID. Saw Dr. Octavia, felt to  be stable, thinking this may be her baseline. Follow up in October. Since LP in July 2023, less headache. Feels doing very well.   Update July 03, 2023 SS: Is on Ozempic, has been on for 3 months, has lost almost 40 lbs since Aug!! Is planning for bariatric surgery, needs to have BMI at 55. Taking Topamax  100 mg BID, with Ozempic, feels nauseated.  Seeing Dr. Octavia in June. Has 1-2 migraines a month, triggered by lack of sleep. Has migraine today, but none in 6 weeks. A lot of stress. Uses Tylenol  for migraine with good benefit.   Update 04/08/24 SS: Had gastric sleeve surgery 10/8, went great,has lost 50 lbs since then. Migraines 1-2 a month, stress related, going through divorce. Remains on Topamax , has reduced 100 mg at bedtime. Had to reschedule appointment with Dr. Octavia. Reports stable exam with Dr. Octavia back in summer.   REVIEW OF SYSTEMS: Out of a complete 14 system review of symptoms, the patient complains only of the following symptoms, and all other reviewed systems are negative.  See HPI  ALLERGIES: Allergies  Allergen Reactions   Cefaclor Hives    HOME MEDICATIONS: Outpatient Medications Prior to Visit  Medication Sig Dispense Refill   Multiple Vitamins-Minerals (MULTIVITAMIN ADULTS PO) Take by mouth.     Multiple Vitamins-Minerals (ZINC PO) Take by mouth.     omeprazole (PRILOSEC) 20 MG capsule Take 20 mg by mouth daily.     topiramate  (TOPAMAX ) 100 MG tablet Take 1 tablet (100 mg total) by mouth 2 (two) times daily. 180 tablet 3   atorvastatin (LIPITOR) 20 MG tablet Take 20 mg by mouth daily. (Patient not taking: Reported on 04/08/2024)     metFORMIN (GLUCOPHAGE) 500 MG tablet Take 500 mg by mouth 2 (two) times daily. (Patient not taking: Reported on 04/08/2024)     OZEMPIC, 2 MG/DOSE, 8 MG/3ML SOPN Inject 1 Pen into the skin every 7 (seven) days. (Patient not taking: Reported on 04/08/2024)     ursodiol (ACTIGALL) 300 MG capsule Take 300 mg by mouth 2 (two) times daily.     No facility-administered medications prior to visit.    PAST MEDICAL HISTORY: Past Medical History:  Diagnosis Date   Diabetes mellitus without complication (HCC)     PAST SURGICAL HISTORY: History reviewed. No pertinent surgical history.  FAMILY HISTORY: History reviewed. No pertinent family history.  SOCIAL HISTORY: Social History    Socioeconomic History   Marital status: Married    Spouse name: felix   Number of children: 5   Years of education: Not on file   Highest education level: Some college, no degree  Occupational History   Not on file  Tobacco Use   Smoking status: Never   Smokeless tobacco: Never  Vaping Use   Vaping status: Never Used  Substance and Sexual Activity   Alcohol use: Not Currently   Drug use: Not Currently   Sexual activity: Yes    Birth control/protection: None  Other Topics Concern   Not on file  Social History Narrative   Not on file   Social Drivers of Health   Tobacco Use: Low Risk (04/08/2024)   Patient History    Smoking Tobacco Use: Never    Smokeless Tobacco Use: Never    Passive Exposure: Not on file  Financial Resource Strain: Low Risk (08/24/2023)   Received from [facility]   Overall Financial Resource Strain (CARDIA)    Difficulty of Paying Living Expenses: Not very hard  Food Insecurity:  No Food Insecurity (01/08/2024)   Received from [facility]   Epic    Within the past 12 months, you worried that your food would run out before you got the money to buy more.: Never true    Within the past 12 months, the food you bought just didn't last and you didn't have money to get more.: Never true  Transportation Needs: No Transportation Needs (01/08/2024)   [facility] from [facility]    In the past 12 months, has lack of transportation kept you from medical appointments or from getting medications?: No    In the past 12 months, has lack of transportation kept you from meetings, work, or from getting things needed for daily living?: No  Physical Activity: Insufficiently Active (08/24/2023)   Received from [facility]   Exercise Vital Sign    On average, how many days per week do you engage in moderate to strenuous exercise (like a brisk walk)?: 3 days    On average, how many minutes do you engage in exercise at this level?: 40 min  Stress: No Stress  Concern Present (01/08/2024)   Received from [facility] - Occupational Stress Questionnaire    Do you feel stress - tense, restless, nervous, or anxious, or unable to sleep at night because your mind is troubled all the time - these days?: Not at all  Social Connections: Socially Integrated (08/24/2023)   Received from [facility]   Social Network    How would you rate your social network (family, work, friends)?: Good participation with social networks  Intimate Partner Violence: Not At Risk (01/08/2024)   Received from [facility]   HITS    Over the last 12 months how often did your partner physically hurt you?: Never    Over the last 12 months how often did your partner insult you or talk down to you?: Never    Over the last 12 months how often did your partner threaten you with physical harm?: Never    Over the last 12 months how often did your partner scream or curse at you?: Never  Depression (PHQ2-9): Not on file  Alcohol Screen: Not on file  Housing: Low Risk (01/08/2024)   Received from [facility]    In the last 12 months, was there a time when you were not able to pay the mortgage or rent on time?: No    In the past 12 months, how many times have you moved where you were living?: 0    At any time in the past 12 months, were you homeless or living in a shelter (including now)?: No  Utilities: Not At Risk (01/08/2024)   Received from [facility]    In the past 12 months has the electric, gas, oil, or water company threatened to shut off services in your home?: No  Health Literacy: Not on file   PHYSICAL EXAM  Vitals:   04/08/24 0745  BP: 111/79  Weight: (!) 323 lb 8 oz (146.7 kg)  Height: 5' 5 (1.651 m)   Body mass index is 53.83 kg/m.  Generalized: Well developed, in no acute distress  Neurological examination  Mentation: Alert oriented to time, place, history taking. Follows all commands speech and  language fluent Cranial nerve II-XII: Pupils were equal round reactive to light. Extraocular movements were full, visual field were full on confrontational test. Facial sensation and strength  were normal. . Head turning and shoulder shrug  were normal and symmetric. Motor: The motor testing reveals 5 over 5 strength of all 4 extremities. Good symmetric motor tone is noted throughout.  Sensory: Sensory testing is intact to soft touch on all 4 extremities. No evidence of extinction is noted.  Coordination: Cerebellar testing reveals good finger-nose-finger bilaterally Gait and station: Gait is normal  DIAGNOSTIC DATA (LABS, IMAGING, TESTING) - I reviewed patient records, labs, notes, testing and imaging myself where available.  No results found for: WBC, HGB, HCT, MCV, PLT No results found for: NA, K, CL, CO2, GLUCOSE, BUN, CREATININE, CALCIUM, PROT, ALBUMIN, AST, ALT, ALKPHOS, BILITOT, GFRNONAA, GFRAA No results found for: CHOL, HDL, LDLCALC, LDLDIRECT, TRIG, CHOLHDL No results found for: YHAJ8R No results found for: VITAMINB12 No results found for: TSH  Lauraine Born, AGNP-C, DNP 04/08/2024, 8:13 AM [facility] [address], [address] [place], [place] [address] [phone]  "

## 2024-10-27 ENCOUNTER — Ambulatory Visit: Admitting: Neurology
